# Patient Record
Sex: Female | Born: 1990 | Race: Black or African American | Hispanic: No | Marital: Single | Smoking: Never smoker
Health system: Southern US, Community
[De-identification: ages and names within clinical notes are randomized; demographics above are authoritative.]

## PROBLEM LIST (undated history)

## (undated) DIAGNOSIS — E282 Polycystic ovarian syndrome: Secondary | ICD-10-CM

## (undated) DIAGNOSIS — E2609 Other primary hyperaldosteronism: Secondary | ICD-10-CM

## (undated) DIAGNOSIS — I10 Essential (primary) hypertension: Secondary | ICD-10-CM

## (undated) HISTORY — PX: NO PAST SURGERIES: SHX2092

## (undated) HISTORY — DX: Other primary hyperaldosteronism: E26.09

---

## 2016-01-08 ENCOUNTER — Inpatient Hospital Stay (HOSPITAL_COMMUNITY)
Admission: EM | Admit: 2016-01-08 | Discharge: 2016-01-11 | DRG: 305 | Disposition: A | Discharge status: Home / Self Care | Payer: BLUE CROSS/BLUE SHIELD | Attending: Internal Medicine | Admitting: Internal Medicine

## 2016-01-08 ENCOUNTER — Encounter (HOSPITAL_COMMUNITY): Payer: Self-pay | Admitting: Emergency Medicine

## 2016-01-08 ENCOUNTER — Emergency Department (HOSPITAL_COMMUNITY): Payer: BLUE CROSS/BLUE SHIELD

## 2016-01-08 DIAGNOSIS — E876 Hypokalemia: Secondary | ICD-10-CM | POA: Diagnosis present

## 2016-01-08 DIAGNOSIS — Z9114 Patient's other noncompliance with medication regimen: Secondary | ICD-10-CM

## 2016-01-08 DIAGNOSIS — R5383 Other fatigue: Secondary | ICD-10-CM | POA: Diagnosis present

## 2016-01-08 DIAGNOSIS — T501X5A Adverse effect of loop [high-ceiling] diuretics, initial encounter: Secondary | ICD-10-CM | POA: Diagnosis present

## 2016-01-08 DIAGNOSIS — Z91120 Patient's intentional underdosing of medication regimen due to financial hardship: Secondary | ICD-10-CM

## 2016-01-08 DIAGNOSIS — N179 Acute kidney failure, unspecified: Secondary | ICD-10-CM | POA: Diagnosis present

## 2016-01-08 DIAGNOSIS — Z8249 Family history of ischemic heart disease and other diseases of the circulatory system: Secondary | ICD-10-CM

## 2016-01-08 DIAGNOSIS — I10 Essential (primary) hypertension: Secondary | ICD-10-CM | POA: Diagnosis present

## 2016-01-08 DIAGNOSIS — R748 Abnormal levels of other serum enzymes: Secondary | ICD-10-CM | POA: Diagnosis present

## 2016-01-08 DIAGNOSIS — R609 Edema, unspecified: Secondary | ICD-10-CM

## 2016-01-08 DIAGNOSIS — R079 Chest pain, unspecified: Secondary | ICD-10-CM | POA: Diagnosis present

## 2016-01-08 DIAGNOSIS — I161 Hypertensive emergency: Principal | ICD-10-CM | POA: Diagnosis present

## 2016-01-08 DIAGNOSIS — Z818 Family history of other mental and behavioral disorders: Secondary | ICD-10-CM

## 2016-01-08 DIAGNOSIS — R002 Palpitations: Secondary | ICD-10-CM | POA: Diagnosis present

## 2016-01-08 DIAGNOSIS — E282 Polycystic ovarian syndrome: Secondary | ICD-10-CM | POA: Diagnosis present

## 2016-01-08 DIAGNOSIS — D649 Anemia, unspecified: Secondary | ICD-10-CM | POA: Diagnosis present

## 2016-01-08 DIAGNOSIS — Z6841 Body Mass Index (BMI) 40.0 and over, adult: Secondary | ICD-10-CM

## 2016-01-08 DIAGNOSIS — I509 Heart failure, unspecified: Secondary | ICD-10-CM

## 2016-01-08 HISTORY — DX: Essential (primary) hypertension: I10

## 2016-01-08 HISTORY — DX: Polycystic ovarian syndrome: E28.2

## 2016-01-08 LAB — I-STAT TROPONIN, ED: TROPONIN I, POC: 0.03 ng/mL (ref 0.00–0.08)

## 2016-01-08 LAB — CBC
HCT: 32.8 % — ABNORMAL LOW (ref 36.0–46.0)
Hemoglobin: 10.3 g/dL — ABNORMAL LOW (ref 12.0–15.0)
MCH: 26.3 pg (ref 26.0–34.0)
MCHC: 31.4 g/dL (ref 30.0–36.0)
MCV: 83.7 fL (ref 78.0–100.0)
PLATELETS: 306 10*3/uL (ref 150–400)
RBC: 3.92 MIL/uL (ref 3.87–5.11)
RDW: 15.2 % (ref 11.5–15.5)
WBC: 10.6 10*3/uL — AB (ref 4.0–10.5)

## 2016-01-08 LAB — BASIC METABOLIC PANEL
Anion gap: 11 (ref 5–15)
BUN: 14 mg/dL (ref 6–20)
CALCIUM: 9.7 mg/dL (ref 8.9–10.3)
CO2: 28 mmol/L (ref 22–32)
Chloride: 104 mmol/L (ref 101–111)
Creatinine, Ser: 1.05 mg/dL — ABNORMAL HIGH (ref 0.44–1.00)
GFR calc Af Amer: >60 mL/min (ref >60)
Glucose, Bld: 109 mg/dL — ABNORMAL HIGH (ref 65–99)
POTASSIUM: 3.1 mmol/L — AB (ref 3.5–5.1)
SODIUM: 143 mmol/L (ref 135–145)

## 2016-01-08 LAB — BRAIN NATRIURETIC PEPTIDE: B NATRIURETIC PEPTIDE 5: 326.5 pg/mL — AB (ref 0.0–100.0)

## 2016-01-08 MED ORDER — NITROGLYCERIN IN D5W 200-5 MCG/ML-% IV SOLN
0.0000 ug/min | Freq: Once | INTRAVENOUS | Status: AC
  Administered 2016-01-09: 5 ug/min via INTRAVENOUS
  Filled 2016-01-08: qty 250

## 2016-01-08 MED ORDER — FUROSEMIDE 10 MG/ML IJ SOLN
60.0000 mg | Freq: Once | INTRAMUSCULAR | Status: AC
  Administered 2016-01-09: 60 mg via INTRAVENOUS
  Filled 2016-01-08: qty 6

## 2016-01-08 MED ORDER — POTASSIUM CHLORIDE CRYS ER 20 MEQ PO TBCR
40.0000 meq | EXTENDED_RELEASE_TABLET | Freq: Once | ORAL | Status: AC
  Administered 2016-01-08: 40 meq via ORAL
  Filled 2016-01-08: qty 2

## 2016-01-08 NOTE — ED Notes (Signed)
Pt c/o CP x2 days, and SOB x1 week, pt states she had similar problem last year and it was due to low iron, pt states shes borderline anemic. Pt in NAD at this time.

## 2016-01-08 NOTE — ED Notes (Signed)
MD at bedside. 

## 2016-01-08 NOTE — ED Provider Notes (Signed)
CSN: [id number]     Arrival date & time 01/08/16  1842 History   First MD Initiated Contact with Patient 01/08/16 2228     Chief Complaint  Patient presents with  . Chest Pain   HPI Patient reports a 1 week history of SOB with rest and ambulation.  She also notes chest pressure that is 7/10, present with SOB.  She notes CP at rest and with ambulation.  Chest pain not relieved by Motrin/ Tylenol. She reports intermittent nausea, vomiting x1.  Endorsing productive cough, orthopnea and LE edema. No rhinorrhea, sore throat, headache, visual changes, diaphoresis.  No known history of pulmonary or cardiac disease.  She reports h/o HTN for which she was on some antihypertensive that she cannot recall the name of.  She notes that she ran out of this medication several days ago.  Does not have a PCP.    Past Medical History  Diagnosis Date  . Polycystic disease, ovaries   . Hypertension    History reviewed. No pertinent past surgical history. No family history on file. Social History  Substance Use Topics  . Smoking status: Never Smoker   . Smokeless tobacco: None  . Alcohol Use: Yes   OB History    No data available     Review of Systems  Constitutional: Positive for fever (subjective).  HENT: Negative for congestion, rhinorrhea and sore throat.   Eyes: Negative for photophobia and visual disturbance.  Respiratory: Positive for cough, chest tightness (pressure) and shortness of breath. Negative for wheezing.   Cardiovascular: Positive for chest pain, palpitations and leg swelling.  Gastrointestinal: Positive for nausea and vomiting (x1). Negative for abdominal pain and diarrhea.  Musculoskeletal: Negative for myalgias.  Skin: Negative for rash.  Neurological: Negative for dizziness, weakness and light-headedness.    Allergies  Review of patient's allergies indicates no known allergies.  Home Medications   Prior to Admission medications   Not on File   BP 196/150 mmHg  Pulse 91   Temp(Src) 99.6 F (37.6 C) (Oral)  Resp 27  Ht  (1.626 m)  Wt 116.212 kg  BMI 43.96 kg/m2  SpO2 98%  LMP 12/08/2015 Physical Exam  Constitutional: She is oriented to person, place, and time. She appears well-developed and well-nourished. No distress.  obese  HENT:  Head: Normocephalic and atraumatic.  Mouth/Throat: Oropharynx is clear and moist.  Eyes: Conjunctivae are normal. Pupils are equal, round, and reactive to light.  Neck: Normal range of motion. Neck supple. No JVD present.  Cardiovascular: Normal rate, regular rhythm and normal heart sounds.   No murmur heard. Pulmonary/Chest: Effort normal and breath sounds normal. No respiratory distress. She has no wheezes. She has no rales.  Abdominal: She exhibits no distension. There is no tenderness. There is no rebound and no guarding.  Musculoskeletal: Normal range of motion. Edema: +1 LE edema to ankles bilaterally.  Neurological: She is alert and oriented to person, place, and time. She exhibits normal muscle tone. Coordination normal.  Skin: Skin is warm. No rash noted. She is not diaphoretic. No erythema.  Psychiatric: She has a normal mood and affect. Her behavior is normal. Judgment and thought content normal.   ED Course  Procedures (including critical care time) Labs Review Labs Reviewed  BASIC METABOLIC PANEL - Abnormal; Notable for the following:    Potassium 3.1 (*)    Glucose, Bld 109 (*)    Creatinine, Ser 1.05 (*)    All other components within normal limits  CBC -  Abnormal; Notable for the following:    WBC 10.6 (*)    Hemoglobin 10.3 (*)    HCT 32.8 (*)    All other components within normal limits  BRAIN NATRIURETIC PEPTIDE - Abnormal; Notable for the following:    B Natriuretic Peptide 326.5 (*)    All other components within normal limits  I-STAT TROPOININ, ED    Imaging Review Dg Chest 2 View  01/08/2016  CLINICAL DATA:  Chest pain for 2 days.  Shortness of breath EXAM: CHEST  2 VIEW  COMPARISON:  None. FINDINGS: Cardiomegaly. Mild vascular congestion. No overt edema. No confluent opacities or effusions. No acute bony abnormality. IMPRESSION: Cardiomegaly, vascular congestion. Electronically Signed   By: Charlett Nose M.D.   On: 01/08/2016 19:45   I have personally reviewed and evaluated these images and lab results as part of my medical decision-making.   EKG Interpretation   Date/Time:  Friday January 08 2016 18:52:31 EST Ventricular Rate:  87 PR Interval:  152 QRS Duration: 74 QT Interval:  392 QTC Calculation: 471 R Axis:   82 Text Interpretation:  Sinus rhythm with Premature atrial complexes  Biatrial enlargement Abnormal ECG Confirmed by BEATON  MD, ROBERT ([id number])  on 01/08/2016 10:57:17 PM      MDM   Final diagnoses:  None   2231: Mild leukocytosis 10.6.  Low grade fever 99.2F.  Hgb 10.3.  No previous labs to compare.  K 3.1. KDur 40 mEq x1 ordered.  iTrop negative x1.  CXR with cardiomegaly and vascular congestion.  Again, no previous to compare to.  EKG with PACs but no evidence of ischemia.  2315: Discussed care with my attending Dr Radford Pax.  Recommending that we treat BP with Lasix IV  x1, Nitro gtt to reduce afterload.  Will continue to monitor BP.  Repeat iTrop also ordered.  2355: BNP elevated to 326.5.  Consult placed for unassigned medical admission.  OLA RAAP is a 25 y.o. female that presents to ED with 1 week duration of SOB and chest pressure.  She appears fluid overloaded on exam, though pulmonary exam WNL.  CXR with vascular congestion and cardiomegaly.  BNP 326.5.  BP elevated to 190/150's, exhibiting hypertensive emergency with constellation of symptoms.  Nitro gtt and Lasix IV  x1 ordered.  Highly suspect new onset CHF likely secondary to uncontrolled HTN and possible OSA vs OHS.  Low suspicion for PE or pericarditis at this point.  Discussed care with Dr Robb Matar.  Will admit patient to SDU for continued nitro gtt and chest pain  management.  Raliegh Ip, DO 01/09/16 1610  Nelva Nay, MD 01/21/16 [phone]

## 2016-01-09 ENCOUNTER — Observation Stay (HOSPITAL_COMMUNITY): Payer: BLUE CROSS/BLUE SHIELD

## 2016-01-09 ENCOUNTER — Encounter (HOSPITAL_COMMUNITY): Payer: Self-pay | Admitting: Internal Medicine

## 2016-01-09 DIAGNOSIS — I509 Heart failure, unspecified: Secondary | ICD-10-CM

## 2016-01-09 DIAGNOSIS — R079 Chest pain, unspecified: Secondary | ICD-10-CM | POA: Diagnosis present

## 2016-01-09 DIAGNOSIS — D649 Anemia, unspecified: Secondary | ICD-10-CM | POA: Diagnosis present

## 2016-01-09 DIAGNOSIS — I10 Essential (primary) hypertension: Secondary | ICD-10-CM | POA: Diagnosis present

## 2016-01-09 DIAGNOSIS — R5383 Other fatigue: Secondary | ICD-10-CM | POA: Diagnosis present

## 2016-01-09 DIAGNOSIS — R748 Abnormal levels of other serum enzymes: Secondary | ICD-10-CM | POA: Diagnosis present

## 2016-01-09 DIAGNOSIS — E876 Hypokalemia: Secondary | ICD-10-CM | POA: Diagnosis present

## 2016-01-09 DIAGNOSIS — Z91120 Patient's intentional underdosing of medication regimen due to financial hardship: Secondary | ICD-10-CM | POA: Diagnosis not present

## 2016-01-09 DIAGNOSIS — E282 Polycystic ovarian syndrome: Secondary | ICD-10-CM | POA: Diagnosis present

## 2016-01-09 DIAGNOSIS — Z9114 Patient's other noncompliance with medication regimen: Secondary | ICD-10-CM | POA: Diagnosis not present

## 2016-01-09 DIAGNOSIS — R002 Palpitations: Secondary | ICD-10-CM | POA: Diagnosis present

## 2016-01-09 DIAGNOSIS — I5031 Acute diastolic (congestive) heart failure: Secondary | ICD-10-CM | POA: Diagnosis not present

## 2016-01-09 DIAGNOSIS — R609 Edema, unspecified: Secondary | ICD-10-CM | POA: Diagnosis present

## 2016-01-09 DIAGNOSIS — Z8249 Family history of ischemic heart disease and other diseases of the circulatory system: Secondary | ICD-10-CM | POA: Diagnosis not present

## 2016-01-09 DIAGNOSIS — I161 Hypertensive emergency: Principal | ICD-10-CM

## 2016-01-09 DIAGNOSIS — N179 Acute kidney failure, unspecified: Secondary | ICD-10-CM | POA: Diagnosis present

## 2016-01-09 DIAGNOSIS — T501X5A Adverse effect of loop [high-ceiling] diuretics, initial encounter: Secondary | ICD-10-CM | POA: Diagnosis present

## 2016-01-09 DIAGNOSIS — Z6841 Body Mass Index (BMI) 40.0 and over, adult: Secondary | ICD-10-CM | POA: Diagnosis not present

## 2016-01-09 DIAGNOSIS — Z818 Family history of other mental and behavioral disorders: Secondary | ICD-10-CM | POA: Diagnosis not present

## 2016-01-09 LAB — CBC
HCT: 34.7 % — ABNORMAL LOW (ref 36.0–46.0)
Hemoglobin: 10.9 g/dL — ABNORMAL LOW (ref 12.0–15.0)
MCH: 26.5 pg (ref 26.0–34.0)
MCHC: 31.4 g/dL (ref 30.0–36.0)
MCV: 84.2 fL (ref 78.0–100.0)
PLATELETS: 307 10*3/uL (ref 150–400)
RBC: 4.12 MIL/uL (ref 3.87–5.11)
RDW: 15.2 % (ref 11.5–15.5)
WBC: 9.6 10*3/uL (ref 4.0–10.5)

## 2016-01-09 LAB — PHOSPHORUS: Phosphorus: 3.3 mg/dL (ref 2.5–4.6)

## 2016-01-09 LAB — I-STAT TROPONIN, ED: Troponin i, poc: 0.05 ng/mL (ref 0.00–0.08)

## 2016-01-09 LAB — TROPONIN I: Troponin I: 0.05 ng/mL — ABNORMAL HIGH (ref <0.031)

## 2016-01-09 LAB — CREATININE, SERUM
CREATININE: 1.12 mg/dL — AB (ref 0.44–1.00)
GFR calc Af Amer: >60 mL/min (ref >60)
GFR calc non Af Amer: >60 mL/min (ref >60)

## 2016-01-09 LAB — MAGNESIUM: Magnesium: 2 mg/dL (ref 1.7–2.4)

## 2016-01-09 MED ORDER — FUROSEMIDE 10 MG/ML IJ SOLN: 40.0000 mg | Freq: Two times a day (BID) | INTRAMUSCULAR | Status: DC

## 2016-01-09 MED ORDER — NITROGLYCERIN IN D5W 200-5 MCG/ML-% IV SOLN
0.0000 ug/min | Freq: Once | INTRAVENOUS | Status: DC
Start: 2016-01-09 — End: 2016-01-09

## 2016-01-09 MED ORDER — SODIUM CHLORIDE 0.9 % IV SOLN: 250.0000 mL | INTRAVENOUS | Status: DC | PRN

## 2016-01-09 MED ORDER — FUROSEMIDE 10 MG/ML IJ SOLN
20.0000 mg | Freq: Once | INTRAMUSCULAR | Status: AC
  Administered 2016-01-09: 20 mg via INTRAVENOUS
  Filled 2016-01-09: qty 2

## 2016-01-09 MED ORDER — ENOXAPARIN SODIUM 40 MG/0.4ML ~~LOC~~ SOLN: 40.0000 mg | SUBCUTANEOUS | Status: DC

## 2016-01-09 MED ORDER — CARVEDILOL 12.5 MG PO TABS
12.5000 mg | ORAL_TABLET | Freq: Two times a day (BID) | ORAL | Status: DC
  Administered 2016-01-09 – 2016-01-11 (×5): 12.5 mg via ORAL
  Filled 2016-01-09 (×5): qty 1

## 2016-01-09 MED ORDER — LISINOPRIL 10 MG PO TABS
10.0000 mg | ORAL_TABLET | Freq: Every day | ORAL | Status: DC
Start: 2016-01-09 — End: 2016-01-10
  Administered 2016-01-09: 10 mg via ORAL
  Filled 2016-01-09 (×2): qty 1

## 2016-01-09 MED ORDER — ACETAMINOPHEN 325 MG PO TABS
650.0000 mg | ORAL_TABLET | Freq: Four times a day (QID) | ORAL | Status: DC | PRN
  Administered 2016-01-09: 650 mg via ORAL
  Filled 2016-01-09: qty 2

## 2016-01-09 MED ORDER — MAGNESIUM OXIDE 400 (241.3 MG) MG PO TABS
400.0000 mg | ORAL_TABLET | Freq: Two times a day (BID) | ORAL | Status: DC
  Administered 2016-01-09 – 2016-01-11 (×5): 400 mg via ORAL
  Filled 2016-01-09 (×5): qty 1

## 2016-01-09 MED ORDER — ONDANSETRON HCL 4 MG/2ML IJ SOLN
4.0000 mg | Freq: Four times a day (QID) | INTRAMUSCULAR | Status: DC | PRN
  Administered 2016-01-09: 4 mg via INTRAVENOUS
  Filled 2016-01-09: qty 2

## 2016-01-09 MED ORDER — SODIUM CHLORIDE 0.9% FLUSH: 3.0000 mL | INTRAVENOUS | Status: DC | PRN

## 2016-01-09 MED ORDER — HYDRALAZINE HCL 20 MG/ML IJ SOLN
10.0000 mg | Freq: Four times a day (QID) | INTRAMUSCULAR | Status: DC | PRN
  Filled 2016-01-09: qty 1

## 2016-01-09 MED ORDER — NITROGLYCERIN 0.4 MG SL SUBL
0.4000 mg | SUBLINGUAL_TABLET | SUBLINGUAL | Status: DC | PRN
  Administered 2016-01-09: 0.4 mg via SUBLINGUAL
  Filled 2016-01-09: qty 1

## 2016-01-09 MED ORDER — MORPHINE SULFATE (PF) 2 MG/ML IV SOLN: 1.0000 mg | INTRAVENOUS | Status: DC | PRN

## 2016-01-09 MED ORDER — ENOXAPARIN SODIUM 60 MG/0.6ML ~~LOC~~ SOLN
60.0000 mg | SUBCUTANEOUS | Status: DC
  Administered 2016-01-09 – 2016-01-11 (×3): 60 mg via SUBCUTANEOUS
  Filled 2016-01-09 (×3): qty 0.6

## 2016-01-09 MED ORDER — POTASSIUM CHLORIDE CRYS ER 20 MEQ PO TBCR
20.0000 meq | EXTENDED_RELEASE_TABLET | Freq: Two times a day (BID) | ORAL | Status: DC
  Administered 2016-01-09 (×2): 20 meq via ORAL
  Filled 2016-01-09 (×3): qty 1

## 2016-01-09 MED ORDER — ASPIRIN EC 81 MG PO TBEC
81.0000 mg | DELAYED_RELEASE_TABLET | Freq: Every day | ORAL | Status: DC
  Administered 2016-01-09 – 2016-01-11 (×3): 81 mg via ORAL
  Filled 2016-01-09 (×3): qty 1

## 2016-01-09 MED ORDER — SODIUM CHLORIDE 0.9% FLUSH
3.0000 mL | Freq: Two times a day (BID) | INTRAVENOUS | Status: DC
Start: 2016-01-09 — End: 2016-01-11
  Administered 2016-01-09 – 2016-01-10 (×4): 3 mL via INTRAVENOUS

## 2016-01-09 MED ORDER — PANTOPRAZOLE SODIUM 40 MG PO TBEC
40.0000 mg | DELAYED_RELEASE_TABLET | Freq: Every day | ORAL | Status: DC
  Administered 2016-01-10 – 2016-01-11 (×2): 40 mg via ORAL
  Filled 2016-01-09 (×2): qty 1

## 2016-01-09 NOTE — ED Notes (Signed)
Spoke with admitting MD. Made aware of BP, will give coreg now and hydralazine.

## 2016-01-09 NOTE — H&P (Signed)
[facility] History and Physical  Destiny Dunn WUJ:[id number] DOB: [birth year] DOA: 01/08/2016  Referring physician: Raliegh Ip, DO PCP: No primary care provider on file.   Chief Complaint: Chest pain.  HPI: Destiny Dunn is a 25 y.o. female with a past medical history of hypertension, polycystic ovaries, morbid obesity who comes to the emergency department with a history of chest pain since yesterday, pressure-like associated with dyspnea, palpitations, lower extremity edema and fatigue. Patient also has had progressively worse dyspnea for the past week, since she is stopped her antihypertensive medications.  Per patient, she has had a labs on health insurance coverage and was unable to get her medications. Since she stopped in, she has been getting progressively worse dyspnea, associated with orthopnea, PND, cough and lower extremity edema. She has had chest pain as earlier described. She had an episode of nausea and emesis on Thursday, but denies abdominal pain and diarrhea, constipation melena or hematochezia.  When seen, the patient was in no acute distress she stated that she was feeling better with treatment.   Review of Systems:  Constitutional:  Positive fatigue. No weight loss, night sweats, Fevers, chills,   HEENT:  Hospital headache   Difficulty swallowing,Tooth/dental problems,Sore throat,  No sneezing, itching, ear ache, nasal congestion, post nasal drip,  Cardio-vascular:  As earlier mentioned. GI:   nausea, vomiting, change in bowel habits No heartburn, indigestion, abdominal pain,diarrhea,  loss of appetite  Resp:  Positive dyspnea, cough. No wheezing, no hemoptysis. Skin:  no rash or lesions.  GU:  History of polycystic ovaries. no dysuria, change in color of urine, no urgency or frequency. No flank pain.  Musculoskeletal:  No joint pain or swelling. No decreased range of motion. No back pain.  Psych:  No change in mood or affect.  No depression or anxiety. No memory loss.   Past Medical History  Diagnosis Date  . Polycystic disease, ovaries   . Hypertension    History reviewed. No pertinent past surgical history. Social History:  reports that she has never smoked. She does not have any smokeless tobacco history on file. She reports that she drinks alcohol. Her drug history is not on file.  No Known Allergies  History reviewed.  Mother hypertension. Brother schizophrenia  Prior to Admission medications   Not on File   Physical Exam: Filed Vitals:   01/09/16 0145 01/09/16 0155 01/09/16 0200 01/09/16 0248  BP: 181/128 184/117 175/131 164/117  Pulse: 91 90 88 87  Temp:      TempSrc:      Resp:  Height:      Weight:      SpO2: 99% 100% 100% 100%    Wt Readings from Last 3 Encounters:  01/08/16 116.212 kg (256 lb 3.2 oz)    General:  Appears calm and comfortable Eyes: PERRL, normal lids, irises & conjunctiva ENT: Nasal cannula in place, grossly normal hearing, lips & tongue Neck: no LAD, masses or thyromegaly Cardiovascular: RRR, no m/r/g. No LE edema. Telemetry: SR, no arrhythmias  Respiratory: Bibasilar crackles. Normal respiratory effort. Abdomen: Obese, soft, ntnd Skin: no rash or induration seen on limited exam Musculoskeletal: grossly normal tone BUE/BLE Psychiatric: grossly normal mood and affect, speech fluent and appropriate Neurologic: Awake, alert, oriented 3, grossly non-focal.          Labs on Admission:  Basic Metabolic Panel:  Recent Labs Lab 01/07/16 1857 01/08/16 1857  NA  --  143  K  --  3.1*  CL  --  104  CO2  --  28  GLUCOSE  --  109*  BUN  --  14  CREATININE  --  1.05*  CALCIUM  --  9.7  MG 2.0  --   PHOS 3.3  --    CBC:  Recent Labs Lab 01/08/16 1857  WBC 10.6*  HGB 10.3*  HCT 32.8*  MCV 83.7  PLT 306    BNP (last 3 results)  Recent Labs  01/08/16 1857  BNP 326.5*    Radiological Exams on Admission: Dg Chest 2 View  01/08/2016   CLINICAL DATA:  Chest pain for 2 days.  Shortness of breath EXAM: CHEST  2 VIEW COMPARISON:  None. FINDINGS: Cardiomegaly. Mild vascular congestion. No overt edema. No confluent opacities or effusions. No acute bony abnormality. IMPRESSION: Cardiomegaly, vascular congestion. Electronically Signed   By: Charlett Nose M.D.   On: 01/08/2016 19:45    EKG: Independently reviewed. Vent. rate 87 BPM PR interval 152 ms QRS duration 74 ms QT/QTc 392/471 ms P-R-T axes 62 82 16 Sinus rhythm with Premature atrial complexes Biatrial enlargement Abnormal ECG  Assessment/Plan Principal Problem:   Hypertensive emergency Admit to a stepdown. Continue nitroglycerin infusion. Start lisinopril 10 mg by mouth daily. Continue furosemide IV push twice a day.  Active Problems:   Chest pain Cardiac monitoring. Check serial troponin levels. Check echocardiogram.    Hypokalemia Continue oral replacement. Monitor potassium level. Check magnesium level.    CHF, acute (HCC) Continue diuresis with furosemide. Continue supplemental oxygen. Check echocardiogram.    Anemia Monitor hematocrit and hemoglobin. Continue ferrous sulfate 325 mg by mouth daily.    Morbid obesity (HCC) Advised to take drastic lifestyle modifications.    Polycystic ovarian disease Patient to follow-up with GYN as an outpatient. Advised to continue lifestyle modifications for weight loss.    Code Status: Full code. DVT Prophylaxis: Lovenox SQ. Family Communication:  Disposition Plan: Admit to telemetry, cardiac monitoring and troponin levels trending  Time spent: Over 70 minutes were spent in the process admission.  Bobette Mo, M.D. [facility] Pager [phone].

## 2016-01-09 NOTE — ED Notes (Signed)
Admitting MD made aware of BP 165/107, will give coreg now.

## 2016-01-09 NOTE — Progress Notes (Addendum)
Patient arrived 2W - A&O, denies dyspnea or pain. Diastolic remains elevated @ 1519 is 144/104; 1613 - 156/101. Tx paged update to Dr. Jerral Ralph. Dr. Jerral Ralph return phone call - he stated aware and recently adjusted medications including Coreg and Lisinopril - will wait until am to further adjust Rx's.

## 2016-01-09 NOTE — Progress Notes (Signed)
Seen and examined Admitted earlier this am BP much better-no longer SOB-taper off NTG infusion-start Coreg, continue Lisinopril.  Can go to tele Await Echo, Lower Ext Doppler

## 2016-01-09 NOTE — Progress Notes (Signed)
Patient c/o chest tightness, ranks "3" - tx paged Dr. Jerral Ralph.

## 2016-01-09 NOTE — Progress Notes (Signed)
  Echocardiogram 2D Echocardiogram has been performed.  Peola Joynt 01/09/2016, 11:17 AM

## 2016-01-09 NOTE — ED Notes (Signed)
Spoke with admitting MD, reports to give 20 mg IV Lasix, now along with lisinopril. Try to titrate off nitro, plan to down grade to telemetry bed.

## 2016-01-10 ENCOUNTER — Inpatient Hospital Stay (HOSPITAL_COMMUNITY): Payer: BLUE CROSS/BLUE SHIELD

## 2016-01-10 DIAGNOSIS — R609 Edema, unspecified: Secondary | ICD-10-CM

## 2016-01-10 DIAGNOSIS — R079 Chest pain, unspecified: Secondary | ICD-10-CM

## 2016-01-10 DIAGNOSIS — D649 Anemia, unspecified: Secondary | ICD-10-CM

## 2016-01-10 DIAGNOSIS — I5031 Acute diastolic (congestive) heart failure: Secondary | ICD-10-CM

## 2016-01-10 LAB — TSH: TSH: 1.297 u[IU]/mL (ref 0.350–4.500)

## 2016-01-10 LAB — URINALYSIS, ROUTINE W REFLEX MICROSCOPIC
BILIRUBIN URINE: NEGATIVE
GLUCOSE, UA: NEGATIVE mg/dL
Hgb urine dipstick: NEGATIVE
KETONES UR: NEGATIVE mg/dL
Nitrite: NEGATIVE
PROTEIN: 100 mg/dL — AB
Specific Gravity, Urine: 1.022 (ref 1.005–1.030)
pH: 5.5 (ref 5.0–8.0)

## 2016-01-10 LAB — BASIC METABOLIC PANEL
ANION GAP: 9 (ref 5–15)
BUN: 19 mg/dL (ref 6–20)
CHLORIDE: 104 mmol/L (ref 101–111)
CO2: 28 mmol/L (ref 22–32)
Calcium: 9.1 mg/dL (ref 8.9–10.3)
Creatinine, Ser: 1.45 mg/dL — ABNORMAL HIGH (ref 0.44–1.00)
GFR calc Af Amer: 57 mL/min — ABNORMAL LOW (ref >60)
GFR calc non Af Amer: 50 mL/min — ABNORMAL LOW (ref >60)
GLUCOSE: 109 mg/dL — AB (ref 65–99)
POTASSIUM: 3.4 mmol/L — AB (ref 3.5–5.1)
SODIUM: 141 mmol/L (ref 135–145)

## 2016-01-10 LAB — URINE MICROSCOPIC-ADD ON

## 2016-01-10 LAB — TROPONIN I: TROPONIN I: 0.05 ng/mL — AB (ref <0.031)

## 2016-01-10 MED ORDER — POTASSIUM CHLORIDE CRYS ER 20 MEQ PO TBCR
20.0000 meq | EXTENDED_RELEASE_TABLET | Freq: Two times a day (BID) | ORAL | Status: AC
  Administered 2016-01-10 (×2): 20 meq via ORAL
  Filled 2016-01-10 (×2): qty 1

## 2016-01-10 MED ORDER — AMLODIPINE BESYLATE 5 MG PO TABS
5.0000 mg | ORAL_TABLET | Freq: Every day | ORAL | Status: DC
Start: 2016-01-10 — End: 2016-01-11
  Administered 2016-01-10 – 2016-01-11 (×2): 5 mg via ORAL
  Filled 2016-01-10 (×2): qty 1

## 2016-01-10 NOTE — Progress Notes (Addendum)
*  Preliminary Results* Bilateral lower extremity venous duplex completed. Study was technically difficult due to patient body habitus. Visualized veins of bilateral lower extremities are negative for deep vein thrombosis. Bilateral lower extremity veins exhibit pulsatile flow, suggestive of possible elevated right sided heart pressure. There is no evidence of Baker's cyst bilaterally.  01/10/2016  Gertie Fey, RVT, RDCS, RDMS

## 2016-01-10 NOTE — Progress Notes (Signed)
PATIENT DETAILS Name: Destiny Dunn Age: 25 y.o. Sex: female Date of Birth: 1991/01/18 Admit Date: 01/08/2016 Admitting Physician Bobette Mo, MD PCP:No primary care provider on file.  Subjective: Feels much better. No SOB. No chest pain this am.   Assessment/Plan: Principal Problem: Hypertensive emergency: secondary to non compliance to medications-due to insurance issues. BP better-NTG infusion weaned off, continue Coreg and Amlodipine. Will d/c lisinopril given mild increase in creatinine. Will need workup for secondary causes of hypertension-however that can be done in the outpatient setting.  Active Problems: Chest pain: Resolved, doubt cardiac. Echocardiogram did not show any wall motion abnormalities.  Acute diastolic heart failure: Much more compensated, suspect this was secondary to uncontrolled hypertension. Hold Lasix today.  Minimally elevated troponins: Suspect secondary to CHF/hypertensive emergency-trend is flat-not consistent with ACS.  Mild ARF: Likely secondary to Lasix and better blood pressure control. Discontinue lisinopril-repeat electrolytes tomorrow. Check UA.  Morbid obesity: Counseled regarding importance of weight loss  Known history of polycystic ovarian disease: Outpatient follow up with GYN.  Anemia: Chronic issue, suspect stable for outpatient workup/monitoring.  Disposition: Remain inpatient-home 2/27.  Antimicrobial agents  See below  Anti-infectives    None      DVT Prophylaxis: Prophylactic Lovenox  Code Status: Full code   Family Communication Multiple family members at bedside  Procedures: Noen  CONSULTS:  None  Time spent 30 minutes-Greater than 50% of this time was spent in counseling, explanation of diagnosis, planning of further management, and coordination of care.  MEDICATIONS: Scheduled Meds: . amLODipine  5 mg Oral Daily  . aspirin EC  81 mg Oral Daily  . carvedilol  12.5 mg Oral  BID WC  . enoxaparin (LOVENOX) injection  60 mg Subcutaneous Q24H  . magnesium oxide  400 mg Oral BID  . pantoprazole  40 mg Oral Q1200  . potassium chloride  20 mEq Oral BID  . sodium chloride flush  3 mL Intravenous Q12H   Continuous Infusions:  PRN Meds:.sodium chloride, acetaminophen, hydrALAZINE, morphine injection, nitroGLYCERIN, ondansetron (ZOFRAN) IV, sodium chloride flush    PHYSICAL EXAM: Vital signs in last 24 hours: Filed Vitals:   01/09/16 1708 01/09/16 1719 01/09/16 1954 01/10/16 0508  BP: 157/106 155/100 152/103 133/93  Pulse: 82 85 83 68  Temp: 98.5 F (36.9 C)  98.5 F (36.9 C) 98.6 F (37 C)  TempSrc: Oral  Oral Oral  Resp: Height:      Weight:    114.941 kg (253 lb 6.4 oz)  SpO2: 99% 100% 100% 100%    Weight change: -2.268 kg (-5 lb) Filed Weights   01/08/16 1858 01/09/16 1519 01/10/16 0508  Weight: 116.212 kg (256 lb 3.2 oz) 113.944 kg (251 lb 3.2 oz) 114.941 kg (253 lb 6.4 oz)   Body mass index is 43.47 kg/(m^2).   Gen Exam: Awake and alert with clear speech.   Neck: Supple, No JVD.   Chest: B/L Clear.   CVS: S1 S2 Regular, no murmurs.  Abdomen: soft, BS +, non tender, non distended.  Extremities: no edema, lower extremities warm to touch. Neurologic: Non Focal.   Skin: No Rash.   Wounds: N/A.   Intake/Output from previous day:  Intake/Output Summary (Last 24 hours) at 01/10/16 1123 Last data filed at 01/09/16 2300  Gross per 24 hour  Intake    240 ml  Output      0  ml  Net    240 ml     LAB RESULTS: CBC  Recent Labs Lab 01/08/16 1857 01/09/16 0812  WBC 10.6* 9.6  HGB 10.3* 10.9*  HCT 32.8* 34.7*  PLT 306 307  MCV 83.7 84.2  MCH 26.3 26.5  MCHC 31.4 31.4  RDW 15.2 15.2    Chemistries   Recent Labs Lab 01/07/16 1857 01/08/16 1857 01/09/16 0812 01/10/16 0425  NA  --  143  --  141  K  --  3.1*  --  3.4*  CL  --  104  --  104  CO2  --  28  --  28  GLUCOSE  --  109*  --  109*  BUN  --  14  --  19    CREATININE  --  1.05* 1.12* 1.45*  CALCIUM  --  9.7  --  9.1  MG 2.0  --   --   --     CBG: No results for input(s): GLUCAP in the last 168 hours.  GFR Estimated Creatinine Clearance: 73.8 mL/min (by C-G formula based on Cr of 1.45).  Coagulation profile No results for input(s): INR, PROTIME in the last 168 hours.  Cardiac Enzymes  Recent Labs Lab 01/09/16 0812 01/10/16 0425  TROPONINI 0.05* 0.05*    Invalid input(s): POCBNP No results for input(s): DDIMER in the last 72 hours. No results for input(s): HGBA1C in the last 72 hours. No results for input(s): CHOL, HDL, LDLCALC, TRIG, CHOLHDL, LDLDIRECT in the last 72 hours. No results for input(s): TSH, T4TOTAL, T3FREE, THYROIDAB in the last 72 hours.  Invalid input(s): FREET3 No results for input(s): VITAMINB12, FOLATE, FERRITIN, TIBC, IRON, RETICCTPCT in the last 72 hours. No results for input(s): LIPASE, AMYLASE in the last 72 hours.  Urine Studies No results for input(s): UHGB, CRYS in the last 72 hours.  Invalid input(s): UACOL, UAPR, USPG, UPH, UTP, UGL, UKET, UBIL, UNIT, UROB, ULEU, UEPI, UWBC, URBC, UBAC, CAST, UCOM, BILUA  MICROBIOLOGY: No results found for this or any previous visit (from the past 240 hour(s)).  RADIOLOGY STUDIES/RESULTS: Dg Chest 2 View  01/08/2016  CLINICAL DATA:  Chest pain for 2 days.  Shortness of breath EXAM: CHEST  2 VIEW COMPARISON:  None. FINDINGS: Cardiomegaly. Mild vascular congestion. No overt edema. No confluent opacities or effusions. No acute bony abnormality. IMPRESSION: Cardiomegaly, vascular congestion. Electronically Signed   By: Charlett Nose M.D.   On: 01/08/2016 19:45    Jeoffrey Massed, MD  [facility] Pager:[phone]  If 7PM-7AM, please contact night-coverage [email or web address] Password [email or web address] 01/10/2016, 11:23 AM   LOS: 1 day

## 2016-01-10 NOTE — Progress Notes (Signed)
Utilization review completed.  

## 2016-01-11 ENCOUNTER — Encounter (HOSPITAL_COMMUNITY): Payer: Self-pay | Admitting: General Practice

## 2016-01-11 DIAGNOSIS — E282 Polycystic ovarian syndrome: Secondary | ICD-10-CM

## 2016-01-11 LAB — BASIC METABOLIC PANEL
Anion gap: 13 (ref 5–15)
BUN: 24 mg/dL — AB (ref 6–20)
CHLORIDE: 103 mmol/L (ref 101–111)
CO2: 25 mmol/L (ref 22–32)
Calcium: 9.5 mg/dL (ref 8.9–10.3)
Creatinine, Ser: 1.32 mg/dL — ABNORMAL HIGH (ref 0.44–1.00)
GFR, EST NON AFRICAN AMERICAN: 55 mL/min — AB (ref >60)
Glucose, Bld: 105 mg/dL — ABNORMAL HIGH (ref 65–99)
POTASSIUM: 3.5 mmol/L (ref 3.5–5.1)
SODIUM: 141 mmol/L (ref 135–145)

## 2016-01-11 LAB — LIPID PANEL
CHOL/HDL RATIO: 6 ratio
CHOLESTEROL: 155 mg/dL (ref 0–200)
HDL: 26 mg/dL — ABNORMAL LOW (ref >40)
LDL Cholesterol: 96 mg/dL (ref 0–99)
TRIGLYCERIDES: 163 mg/dL — AB (ref <150)
VLDL: 33 mg/dL (ref 0–40)

## 2016-01-11 MED ORDER — CARVEDILOL 12.5 MG PO TABS: 12.5000 mg | ORAL_TABLET | Freq: Two times a day (BID) | ORAL | Status: DC

## 2016-01-11 MED ORDER — PANTOPRAZOLE SODIUM 40 MG PO TBEC: 40.0000 mg | DELAYED_RELEASE_TABLET | Freq: Every day | ORAL | Status: DC

## 2016-01-11 MED ORDER — AMLODIPINE BESYLATE 5 MG PO TABS: 5.0000 mg | ORAL_TABLET | Freq: Every day | ORAL | Status: DC

## 2016-01-11 MED ORDER — ASPIRIN 81 MG PO TBEC: 81.0000 mg | DELAYED_RELEASE_TABLET | Freq: Every day | ORAL | Status: DC

## 2016-01-11 MED ORDER — PANTOPRAZOLE SODIUM 40 MG PO TBEC: 40.0000 mg | DELAYED_RELEASE_TABLET | Freq: Every day | ORAL | Status: AC

## 2016-01-11 MED ORDER — CARVEDILOL 12.5 MG PO TABS: 12.5000 mg | ORAL_TABLET | Freq: Two times a day (BID) | ORAL | Status: AC

## 2016-01-11 MED ORDER — ASPIRIN 81 MG PO TBEC: 81.0000 mg | DELAYED_RELEASE_TABLET | Freq: Every day | ORAL | Status: AC

## 2016-01-11 NOTE — Consult Note (Signed)
CARDIOLOGY CONSULT NOTE  Patient ID: KAYDI KLEY MRN: [id number] DOB/AGE: 09-Dec-1990 25 y.o.  Admit date: 01/08/2016 Referring Physician: Internal Medicine Primary Physician:  No primary care provider on file. Reason for Consultation: Hypertensive Emergency  HPI: Destiny Dunn  is a 25 y.o. female with a history of PCOS and hypertension, previously on medical therapy which was stopped due to loss of insurance. This was started several months ago at an urgent care. She does not have a PCP and has been out of medication for about 4 days. She cannot recall what medications she was on or what Urgent Care she went to.  She presented to the ED on 01/08/2016 with chest pain and shortness of breath for several days with cough, orthopnea, and lower extremity edema. Blood pressure 186/127 on arrival. She was started on Ntg gtt and chest pain and shortness of breath resolved as blood pressure improved. She was then transitioned to PO Coreg and lisinopril. Creatinine increased after starting lisinopril so this was discontinued and she was started on amlodipine.   Past Medical History  Diagnosis Date  . Polycystic disease, ovaries   . Hypertension      History reviewed. No pertinent past surgical history.   History reviewed. No pertinent family history.   Social History: Social History   Social History  . Marital Status: Single    Spouse Name: N/A  . Number of Children: N/A  . Years of Education: N/A   Occupational History  . Not on file.   Social History Main Topics  . Smoking status: Never Smoker   . Smokeless tobacco: Not on file  . Alcohol Use: Yes  . Drug Use: Not on file  . Sexual Activity: Not on file   Other Topics Concern  . Not on file   Social History Narrative     No prescriptions prior to admission     ROS: General: no fevers/chills/night sweats Eyes: no blurry vision, diplopia, or amaurosis ENT: no sore throat or hearing loss Resp: no cough, wheezing,  or hemoptysis CV: no edema or palpitations GI: no abdominal pain, nausea, vomiting, diarrhea, or constipation GU: no dysuria, frequency, or hematuria Skin: no rash Neuro: no headache, numbness, tingling, or weakness of extremities Musculoskeletal: no joint pain or swelling Heme: no bleeding, DVT, or easy bruising Endo: no polydipsia or polyuria    Physical Exam: Blood pressure 156/99, pulse 73, temperature 98.1 F (36.7 C), temperature source Oral, resp. rate 18, height  (1.626 m), weight 117 kg (257 lb 15 oz), last menstrual period 12/08/2015, SpO2 100 %.   General appearance: alert, cooperative, no distress and morbidly obese Neck: no adenopathy, no carotid bruit, no JVD, supple, symmetrical, trachea midline, thyroid not enlarged, symmetric, no tenderness/mass/nodules and short neck, difficult to exam Lungs: clear to auscultation bilaterally Chest wall: no tenderness Heart: regular rate and rhythm, S1, S2 normal, no murmur, click, rub or gallop Extremities: extremities normal, atraumatic, no cyanosis or edema Pulses: 2+ and symmetric Skin: Skin color, texture, turgor normal. No rashes or lesions Neurologic: Grossly normal  Labs:   Lab Results  Component Value Date   WBC 9.6 01/09/2016   HGB 10.9* 01/09/2016   HCT 34.7* 01/09/2016   MCV 84.2 01/09/2016   PLT 307 01/09/2016    Recent Labs Lab 01/11/16 0210  NA 141  K 3.5  CL 103  CO2 25  BUN 24*  CREATININE 1.32*  CALCIUM 9.5  GLUCOSE 105*    Lipid Panel  No results  found for: CHOL, TRIG, HDL, CHOLHDL, VLDL, LDLCALC  BNP (last 3 results)  Recent Labs  01/08/16 1857  BNP 326.5*    HEMOGLOBIN A1C No results found for: HGBA1C, MPG  Cardiac Panel (last 3 results)  Recent Labs  01/09/16 0812 01/10/16 0425  TROPONINI 0.05* 0.05*    Lab Results  Component Value Date   TROPONINI 0.05* 01/10/2016     TSH  Recent Labs  01/10/16 1208  TSH 1.297    EKG 01/09/2016: sinus rhythm at a rate of  85bpm, normal axis, biatrial enlargement, diffuse nonspecific ST and T wave abnormality.  Echo 01/09/2016: LV cavity size was normal. Moderate LVH. LVEF 50-55%. Doppler parameters are consistent with restrictive physiology,indicative of decreased left ventricular diastolic complianceand/or increased left atrial pressure. Doppler parameters areconsistent with high ventricular filling pressure. Left atrium moderately dilated. Right ventricle systolic function was mildly reduced. Right atrium mildly dilated. A trivial pericardial effusion wasidentified.   Radiology: No results found.  Scheduled Meds: . amLODipine  5 mg Oral Daily  . aspirin EC  81 mg Oral Daily  . carvedilol  12.5 mg Oral BID WC  . enoxaparin (LOVENOX) injection  60 mg Subcutaneous Q24H  . magnesium oxide  400 mg Oral BID  . pantoprazole  40 mg Oral Q1200  . sodium chloride flush  3 mL Intravenous Q12H   Continuous Infusions:  PRN Meds:.sodium chloride, acetaminophen, hydrALAZINE, morphine injection, nitroGLYCERIN, ondansetron (ZOFRAN) IV, sodium chloride flush  ASSESSMENT AND PLAN:  1. Hypertensive emergency 2. Acute on chronic diastolic heart failure 3. Chest pain secondary to hypertension 4. Elevated troponin, flat, likely due to accelerated hypertension and acute diastolic heart failure 5. Normocytic anemia 6. Hypokalemia 7. AKI following ACE-I initiation, IV furosemide 8. Morbid obesity  Recommendation: Hypertension likely due to morbid obesity, however, given hypokalemia, will obtain an aldosterone to renin activity ratio to evaluate for hyperaldosteronism. Consider addition of spironolactone after labs have been obtained. Suspect AKI due to diuresis and drop in blood pressure, creatinine improving this morning.    Erling Conte, NP-C 01/11/2016, 7:40 AM [facility] Pager: [phone] Office: [phone] If no answer Cell [phone]

## 2016-01-11 NOTE — Care Management Note (Signed)
Case Management Note Donn Pierini RN, BSN Unit 2W-Case Manager [phone]  Patient Details  Name: Destiny Dunn MRN: [id number] Date of Birth: 1991/08/10  Subjective/Objective:   Pt admitted with HTN emergency                 Action/Plan: PTA pt lived at home- independent- plan to return home- referral received for PCP needs and possible medication assistance- spoke with pt at bedside- pt shows as having [employer]- asked pt if this was correct- pt states "yes, she does have [employer]" pt does not have a PCP- explained that she could use her insurance card to get a list of in-network providers- also provided pt with Management consultant # for provider referral list assistance- also asked pt about medications and if she had any difficulty getting meds- pt states that she does not- pt will have 4 scripts at discharge- that will cost roughly $25-30 dollars- depending on insurance coverage- No further CM needs noted.   Expected Discharge Date:    01/11/16              Expected Discharge Plan:  Home/Self Care  In-House Referral:     Discharge planning Services  CM Consult, Medication Assistance  Post Acute Care Choice:    Choice offered to:     DME Arranged:    DME Agency:     HH Arranged:    HH Agency:     Status of Service:  Completed, signed off  Medicare Important Message Given:    Date Medicare IM Given:    Medicare IM give by:    Date Additional Medicare IM Given:    Additional Medicare Important Message give by:     If discussed at Long Length of Stay Meetings, dates discussed:    Discharge Disposition: home/self care   Additional Comments:  Darrold Span, RN 01/11/2016, 10:07 AM

## 2016-01-11 NOTE — Discharge Summary (Addendum)
PATIENT DETAILS Name: Destiny Dunn Age: 25 y.o. Sex: female Date of Birth: [birth year] MRN: [id number]. Admitting Physician: Bobette Mo, MD PCP:No primary care provider on file.  Admit Date: 01/08/2016 Discharge date: 01/11/2016  Recommendations for Outpatient Follow-up:  1. Aldosterone Renin Ratio pending-please follow 2. Optimize anti-hypertensive regimen 3. Defer work up of causes of secondary HTN to the outpatient setting. 4. Please repeat BMET at next visit   PRIMARY DISCHARGE DIAGNOSIS:  Principal Problem:   Hypertensive emergency Active Problems:   Chest pain   Hypokalemia   CHF, acute (HCC)   Anemia   Morbid obesity (HCC)   Polycystic ovarian disease      PAST MEDICAL HISTORY: Past Medical History  Diagnosis Date  . Polycystic disease, ovaries   . Hypertension     DISCHARGE MEDICATIONS: Current Discharge Medication List    START taking these medications   Details  amLODipine (NORVASC) 5 MG tablet Take 1 tablet (5 mg total) by mouth daily. Qty: 30 tablet, Refills: 0    aspirin 81 MG EC tablet Take 1 tablet (81 mg total) by mouth daily. Qty: 30 tablet, Refills: 0    carvedilol (COREG) 12.5 MG tablet Take 1 tablet (12.5 mg total) by mouth 2 (two) times daily with a meal. Qty: 60 tablet, Refills: 0    pantoprazole (PROTONIX) 40 MG tablet Take 1 tablet (40 mg total) by mouth daily at 12 noon. Qty: 30 tablet, Refills: 0        ALLERGIES:  No Known Allergies  BRIEF HPI:  See H&P, Labs, Consult and Test reports for all details in brief, patient  is a 25 y.o. female with a past medical history of hypertension, polycystic ovaries, morbid obesity who presented to the emergency department with a history of chest pain since yesterday, pressure-like associated with dyspnea, palpitations, lower extremity edema and fatigue.She was found to have increased BP-started on IV NTG infusion and admitted for further evaluation and treatment  CONSULTATIONS:     cardiology  PERTINENT RADIOLOGIC STUDIES: Dg Chest 2 View  01/08/2016  CLINICAL DATA:  Chest pain for 2 days.  Shortness of breath EXAM: CHEST  2 VIEW COMPARISON:  None. FINDINGS: Cardiomegaly. Mild vascular congestion. No overt edema. No confluent opacities or effusions. No acute bony abnormality. IMPRESSION: Cardiomegaly, vascular congestion. Electronically Signed   By: Charlett Nose M.D.   On: 01/08/2016 19:45     PERTINENT LAB RESULTS: CBC:  Recent Labs  01/08/16 1857 01/09/16 0812  WBC 10.6* 9.6  HGB 10.3* 10.9*  HCT 32.8* 34.7*  PLT 306 307   CMET CMP     Component Value Date/Time   NA 141 01/11/2016 0210   K 3.5 01/11/2016 0210   CL 103 01/11/2016 0210   CO2 25 01/11/2016 0210   GLUCOSE 105* 01/11/2016 0210   BUN 24* 01/11/2016 0210   CREATININE 1.32* 01/11/2016 0210   CALCIUM 9.5 01/11/2016 0210   GFRNONAA 55* 01/11/2016 0210   GFRAA >60 01/11/2016 0210    GFR Estimated Creatinine Clearance: 81.9 mL/min (by C-G formula based on Cr of 1.32). No results for input(s): LIPASE, AMYLASE in the last 72 hours.  Recent Labs  01/09/16 0812 01/10/16 0425  TROPONINI 0.05* 0.05*   Invalid input(s): POCBNP No results for input(s): DDIMER in the last 72 hours. No results for input(s): HGBA1C in the last 72 hours.  Recent Labs  01/11/16 0210  CHOL 155  HDL 26*  LDLCALC 96  TRIG 657*  CHOLHDL 6.0  Recent Labs  01/10/16 1208  TSH 1.297   No results for input(s): VITAMINB12, FOLATE, FERRITIN, TIBC, IRON, RETICCTPCT in the last 72 hours. Coags: No results for input(s): INR in the last 72 hours.  Invalid input(s): PT Microbiology: No results found for this or any previous visit (from the past 240 hour(s)).   BRIEF HOSPITAL COURSE:  Hypertensive emergency: secondary to non compliance to medications-due to insurance issues. BP better-NTG infusion weaned off, continue Coreg and Amlodipine.  Will need workup for secondary causes of hypertension-however  that can be done in the outpatient setting.Have asked patient to follow up with Dr Alberteen Spindle 1 week.   Active Problems: Chest pain: Resolved, doubt cardiac. Echocardiogram did not show any wall motion abnormalities.  Acute diastolic heart failure: Much more compensated with IV Lasix, suspect this was secondary to uncontrolled hypertension. Watch off Diuretics for now-as compensated-follow with cardiology in the outpatient setting.  Minimally elevated troponins: Suspect secondary to CHF/hypertensive emergency-trend is flat-not consistent with ACS.Echo showed preserved EF without any wall motion abnormalities.  Mild ARF: Likely secondary to Lasix and better blood pressure control. Creatinine downtrending-will likely level out in a few days.Follow closely as outpatient .  Morbid obesity: Counseled regarding importance of weight loss  Known history of polycystic ovarian disease: Outpatient follow up with GYN.  Anemia: Chronic issue, suspect stable for outpatient workup/monitoring.  TODAY-DAY OF DISCHARGE:  Subjective:   Destiny Dunn today has no headache,no chest abdominal pain,no new weakness tingling or numbness, feels much better wants to go home today.   Objective:   Blood pressure 156/99, pulse 73, temperature 98.1 F (36.7 C), temperature source Oral, resp. rate 18, height 5\' 4"  (1.626 m), weight 117 kg (257 lb 15 oz), last menstrual period 12/08/2015, SpO2 100 %.  Intake/Output Summary (Last 24 hours) at 01/11/16 1438 Last data filed at 01/11/16 0815  Gross per 24 hour  Intake    720 ml  Output   1100 ml  Net   -380 ml   Filed Weights   01/09/16 1519 01/10/16 0508 01/11/16 0533  Weight: 113.944 kg (251 lb 3.2 oz) 114.941 kg (253 lb 6.4 oz) 117 kg (257 lb 15 oz)    Exam Awake Alert, Oriented *3, No new F.N deficits, Normal affect [place].AT,PERRAL Supple Neck,No JVD, No cervical lymphadenopathy appriciated.  Symmetrical Chest wall movement, Good air movement bilaterally,  CTAB RRR,No Gallops,Rubs or new Murmurs, No Parasternal Heave +ve B.Sounds, Abd Soft, Non tender, No organomegaly appriciated, No rebound -guarding or rigidity. No Cyanosis, Clubbing or edema, No new Rash or bruise  DISCHARGE CONDITION: Stable  DISPOSITION: Home  DISCHARGE INSTRUCTIONS:    Activity:  As tolerated with Full fall precautions use walker/cane & assistance as needed  Get Medicines reviewed and adjusted: Please take all your medications with you for your next visit with your Primary MD  Please request your Primary MD to go over all hospital tests and procedure/radiological results at the follow up, please ask your Primary MD to get all Hospital records sent to his/her office.  If you experience worsening of your admission symptoms, develop shortness of breath, life threatening emergency, suicidal or homicidal thoughts you must seek medical attention immediately by calling 911 or calling your MD immediately  if symptoms less severe.  You must read complete instructions/literature along with all the possible adverse reactions/side effects for all the Medicines you take and that have been prescribed to you. Take any new Medicines after you have completely understood and accpet all the possible  adverse reactions/side effects.   Do not drive when taking Pain medications.   Do not take more than prescribed Pain, Sleep and Anxiety Medications  Special Instructions: If you have smoked or chewed Tobacco  in the last 2 yrs please stop smoking, stop any regular Alcohol  and or any Recreational drug use.  Wear Seat belts while driving.  Please note  You were cared for by a hospitalist during your hospital stay. Once you are discharged, your primary care physician will handle any further medical issues. Please note that NO REFILLS for any discharge medications will be authorized once you are discharged, as it is imperative that you return to your primary care physician (or establish a  relationship with a primary care physician if you do not have one) for your aftercare needs so that they can reassess your need for medications and monitor your lab values.   Diet recommendation: Heart Healthy diet  Discharge Instructions    Call MD for:  persistant nausea and vomiting    Complete by:  As directed      Call MD for:  severe uncontrolled pain    Complete by:  As directed      Diet - low sodium heart healthy    Complete by:  As directed      Increase activity slowly    Complete by:  As directed            Follow-up Information    Schedule an appointment as soon as possible for a visit with Yates Decamp, MD.   Specialty:  Cardiology   Why:  Hospital follow up   Contact information:   [address] [place] [address] [phone]       Follow up with [facility].   Why:  if you no longer have insurance can call [facility] for appointment [phone]    Contact information:   Please call for Physician referral list assistance- [phone]-      Total Time spent on discharge equals 25  minutes.  SignedJeoffrey Massed 01/11/2016 2:38 PM

## 2016-01-13 NOTE — Progress Notes (Signed)
Our office called patient to schedule follow up appt and she declined and stated she would seek follow up elsewhere.

## 2016-01-14 LAB — ALDOSTERONE + RENIN ACTIVITY W/ RATIO
ALDO / PRA Ratio: 35.9 — ABNORMAL HIGH (ref 0.0–30.0)
Aldosterone: 25.7 ng/dL (ref 0.0–30.0)
PRA LC/MS/MS: 0.715 ng/mL/h (ref 0.167–5.380)

## 2016-02-19 ENCOUNTER — Encounter: Payer: Self-pay | Admitting: Cardiovascular Disease

## 2016-02-19 ENCOUNTER — Ambulatory Visit (INDEPENDENT_AMBULATORY_CARE_PROVIDER_SITE_OTHER): Payer: BLUE CROSS/BLUE SHIELD | Admitting: Cardiovascular Disease

## 2016-02-19 VITALS — BP 160/128 | HR 68 | Ht 64.0 in | Wt 255.0 lb

## 2016-02-19 DIAGNOSIS — I10 Essential (primary) hypertension: Secondary | ICD-10-CM | POA: Diagnosis not present

## 2016-02-19 DIAGNOSIS — I5031 Acute diastolic (congestive) heart failure: Secondary | ICD-10-CM | POA: Diagnosis not present

## 2016-02-19 DIAGNOSIS — E2609 Other primary hyperaldosteronism: Secondary | ICD-10-CM | POA: Diagnosis not present

## 2016-02-19 DIAGNOSIS — Z79899 Other long term (current) drug therapy: Secondary | ICD-10-CM | POA: Diagnosis not present

## 2016-02-19 MED ORDER — SPIRONOLACTONE 25 MG PO TABS: 25.0000 mg | ORAL_TABLET | Freq: Every day | ORAL | Status: DC

## 2016-02-19 NOTE — Progress Notes (Signed)
Cardiology Office Note   Date:  02/20/2016   ID:  LASHAY OSBORNE, DOB 1991/06/04, MRN [id number]  PCP:  Destiny Hansen, NP  Cardiologist:   Destiny Hook, MD   Chief Complaint  Patient presents with  . New Patient (Initial Visit)    In hospital 2 weeks ago, enlarged heart      History of Present Illness: Destiny Dunn is a 25 y.o. female with hypertension, PCOS, morbid obesity, and chronic diastolic heart failure who presents for hospital follow-up.  Destiny Dunn  was admitted 2/24 through 2/27 with hypertensive emergency and chest pain.   She presented with chest pain or shortness of breath and palpitations. Her blood pressure on presentation was 186/127. She had stopped her medications due to loss of insurance.  Echo revealed LVH and an EF of 50-55% and a restrictive filling pattern.  She was tested for primary hyperaldosteronism and the aldosterone to plasma renin ratio was elevated.  Also, plasma aldosterone was >15.  Given these findings and her history of hypokalemia, this is consistent with a diagnosis of primary hyperaldosteronism.  She reports being diagnosed with hypertension at age 28 or 42. Since then she's been on several different medications without being able to control her blood pressure well. At time she's been unable to afford her medication, which is what happened when she presented to the emergency department. She had been off her medicines for a month. She now has a steady job and has no difficulties with affording medicines.  Since being discharged from the hospital she is feeling much better. She notes mild lower extremity edema but denies orthopnea or PND.  Destiny Dunn walks 4 times per week for 40 minutes to an hour.  She is also on her feet all day at work. She reports that her diet is "okay." She is very conscientious of her salt intake and overall eats healthy foods. However she sometimes goes up to a day without eating. This is because in the past she  does not always have access to food and she is used not eating for a full day and time.     Past Medical History  Diagnosis Date  . Polycystic disease, ovaries   . Hypertension   . 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 No past surgeries       Current Outpatient Prescriptions  Medication Sig Dispense Refill  . amLODipine (NORVASC) 5 MG tablet Take 1 tablet (5 mg total) by mouth daily. 30 tablet 0  . aspirin 81 MG EC tablet Take 1 tablet (81 mg total) by mouth daily. 30 tablet 0  . carvedilol (COREG) 12.5 MG tablet Take 1 tablet (12.5 mg total) by mouth 2 (two) times daily with a meal. 60 tablet 0  . pantoprazole (PROTONIX) 40 MG tablet Take 1 tablet (40 mg total) by mouth daily at 12 noon. 30 tablet 0  . spironolactone (ALDACTONE) 25 MG tablet Take 1 tablet (25 mg total) by mouth daily. 30 tablet 5   No current facility-administered medications for this visit.    Allergies:   Review of patient's allergies indicates no known allergies.    Social History:  The patient  reports that she has never smoked. She has never used smokeless tobacco. She reports that she drinks alcohol. She reports that she does not use illicit drugs.   Family History:  The patient's family history includes Breast cancer in her maternal  grandmother; Hypertension in her mother.    ROS:  Please see the history of present illness.   Otherwise, review of systems are positive for none.   All other systems are reviewed and negative.    PHYSICAL EXAM: VS:  BP 160/128 mmHg  Pulse 68  Ht 5\' 4"  (1.626 m)  Wt 115.667 kg (255 lb)  BMI 43.75 kg/m2  LMP 02/08/2016 , BMI Body mass index is 43.75 kg/(m^2). GENERAL:  Well appearing HEENT:  Pupils equal round and reactive, fundi not visualized, oral mucosa unremarkable NECK:  No jugular venous distention, waveform within normal limits, carotid upstroke brisk and symmetric, no bruits, no  thyromegaly LYMPHATICS:  No cervical adenopathy LUNGS:  Clear to auscultation bilaterally HEART:  RRR.  PMI not displaced or sustained,S1 and S2 within normal limits, no S3, no S4, no clicks, no rubs, no murmurs ABD:  Flat, positive bowel sounds normal in frequency in pitch, no bruits, no rebound, no guarding, no midline pulsatile mass, no hepatomegaly, no splenomegaly EXT:  2 plus pulses throughout, no edema, no cyanosis no clubbing SKIN:  No rashes no nodules NEURO:  Cranial nerves II through XII grossly intact, motor grossly intact throughout PSYCH:  Cognitively intact, oriented to person place and time   EKG:  EKG is ordered today. The ekg ordered today demonstrates sinus rhythm rate 68 bpm.  Non-specific ST-T changes.  Echo 01/09/16: Study Conclusions  - Left ventricle: The cavity size was normal. There was moderate  concentric hypertrophy. Systolic function was normal. The  estimated ejection fraction was in the range of 50% to 55%.  Doppler parameters are consistent with restrictive physiology,  indicative of decreased left ventricular diastolic compliance  and/or increased left atrial pressure. Doppler parameters are  consistent with high ventricular filling pressure. - Left atrium: The atrium was moderately dilated. - Right ventricle: Systolic function was mildly reduced. - Right atrium: The atrium was mildly dilated. - Pericardium, extracardiac: A trivial pericardial effusion was  identified.  Recent Labs: 01/07/2016: Magnesium 2.0 01/08/2016: B Natriuretic Peptide 326.5* 01/09/2016: Hemoglobin 10.9*; Platelets 307 01/10/2016: TSH 1.297 01/11/2016: BUN 24*; Creatinine, Ser 1.32*; Potassium 3.5; Sodium 141   01/11/16: Plasma renin 0.175 Aldo/PRA ratio 35.9 Aldosterone 25.7   Lpid Panel    Component Value Date/Time   CHOL 155 01/11/2016 0210   TRIG 163* 01/11/2016 0210   HDL 26* 01/11/2016 0210   CHOLHDL 6.0 01/11/2016 0210   VLDL 33 01/11/2016 0210   LDLCALC  96 01/11/2016 0210      Wt Readings from Last 3 Encounters:  02/19/16 115.667 kg (255 lb)  01/11/16 117 kg (257 lb 15 oz)      ASSESSMENT AND PLAN:  # Primary hyeraldosteronism: Given Destiny Dunn's high serum aldosterone level and elevated Aldo to PRA ratio, she has a diagnosis of primary hyperaldosteronism.  She also has a history of hypokalemia.  We need to determine whether she has adrenal adenoma or bilateral adrenal hyperplasia. We will obtain an adrenal CT to make this distinction as well as to rule out an adrenal carcinoma start spironolactone 25 mg daily. .    # Hypertension: We will start spironolactone 25 mg daily and check a BMP in 2 weeks.  Continue carvedilol and amlodipine.    # CV Disease Prevention: Ms. Raechel AcheHackett is currently on aspirin. She i 24 and there is no clear indication. We will discontinue today.   Current medicines are reviewed at length with the patient today.  The patient does not have concerns regarding  medicines.  The following changes have been made:  no change  Labs/ tests ordered today include:   Orders Placed This Encounter  Procedures  . CT Adrenal Abd WO  . Basic metabolic panel  . EKG 12-Lead     Disposition:   FU with Devanie Galanti C. Duke Salvia, MD, [facility] in 1 month, [facility] in 2 weeks.   This note was written with the assistance of speech recognition software.  Please excuse any transcriptional errors.  Signed, Dany Walther C. Duke Salvia, MD, [facility]  02/20/2016 7:44 AM    [facility]

## 2016-02-19 NOTE — Patient Instructions (Addendum)
Medication Instructions:  START SPIRONOLACTONE 25 MG DAILY   Labwork: BMET IN 2 WEEKS WHEN YOUR RETURN TO SEE KRISTIN PHARM D WILL  BE DONE AFTER VISIT AT [facility] ON THE FIRST FLOOR  Testing/Procedures: ADRENAL CT AT [facility] [address] ONE April 13 ARRIVE AT 11:40 FOR A 12 NOON APPOINTMENT. YOU WILL NEED TO GO BY BEFORE April 12 TO PICK UP CONTRAST AND INSTRUCTIONS   Follow-Up: Your physician recommends that you schedule a follow-up appointment in: 2 weeks with Roney MarionKristin Pharm D for blood pressure check and labs downstairs  Your physician recommends that you schedule a follow-up appointment in: 1 month with Dr Duke Salviaandolph   If you need a refill on your cardiac medications before your next appointment, please call your pharmacy.

## 2016-02-20 ENCOUNTER — Encounter: Payer: Self-pay | Admitting: Cardiovascular Disease

## 2016-02-20 DIAGNOSIS — E2609 Other primary hyperaldosteronism: Secondary | ICD-10-CM

## 2016-02-20 HISTORY — DX: Other primary hyperaldosteronism: E26.09

## 2016-02-25 ENCOUNTER — Ambulatory Visit
Admission: RE | Admit: 2016-02-25 | Discharge: 2016-02-25 | Disposition: A | Discharge status: Home / Self Care | Payer: BLUE CROSS/BLUE SHIELD | Source: Ambulatory Visit | Attending: Cardiovascular Disease | Admitting: Cardiovascular Disease

## 2016-02-25 DIAGNOSIS — E2609 Other primary hyperaldosteronism: Secondary | ICD-10-CM

## 2016-03-04 ENCOUNTER — Ambulatory Visit (INDEPENDENT_AMBULATORY_CARE_PROVIDER_SITE_OTHER): Payer: BLUE CROSS/BLUE SHIELD | Admitting: Pharmacist Clinician (PhC)/ Clinical Pharmacy Specialist

## 2016-03-04 VITALS — BP 158/108 | HR 72 | Ht 64.0 in | Wt 261.4 lb

## 2016-03-04 DIAGNOSIS — E2609 Other primary hyperaldosteronism: Secondary | ICD-10-CM | POA: Diagnosis not present

## 2016-03-04 NOTE — Patient Instructions (Signed)
Return for a a follow up appointment in 3 weeks with Dr. Duke Salviaandolph  Hyperaldosteronism  Your blood pressure today is 158/108  (goal <140/90)  If you decide to get a blood pressure cuff, look at [employer] brand.  They seem to be most reliable.  [facility] and [facility] also work well.   Avoid [employer]/ [employer] meters  Take your BP meds as follows: continue with current medications  Go to the lab today for blood work  Bring all of your meds, your BP cuff and your record of home blood pressures to your next appointment.  Exercise as you're able, try to walk approximately 30 minutes per day.  Keep salt intake to a minimum, especially watch canned and prepared boxed foods.  Eat more fresh fruits and vegetables and fewer canned items.  Avoid eating in fast food restaurants.    HOW TO TAKE YOUR BLOOD PRESSURE: . Rest 5 minutes before taking your blood pressure. .  Don't smoke or drink caffeinated beverages for at least 30 minutes before. . Take your blood pressure before (not after) you eat. . Sit comfortably with your back supported and both feet on the floor (don't cross your legs). . Elevate your arm to heart level on a table or a desk. . Use the proper sized cuff. It should fit smoothly and snugly around your bare upper arm. There should be enough room to slip a fingertip under the cuff. The bottom edge of the cuff should be 1 inch above the crease of the elbow. . Ideally, take 3 measurements at one sitting and record the average.

## 2016-03-05 LAB — BASIC METABOLIC PANEL
BUN: 14 mg/dL (ref 7–25)
CHLORIDE: 106 mmol/L (ref 98–110)
CO2: 26 mmol/L (ref 20–31)
Calcium: 8.8 mg/dL (ref 8.6–10.2)
Creat: 0.95 mg/dL (ref 0.50–1.10)
Glucose, Bld: 72 mg/dL (ref 65–99)
POTASSIUM: 3.9 mmol/L (ref 3.5–5.3)
Sodium: 139 mmol/L (ref 135–146)

## 2016-03-07 ENCOUNTER — Encounter: Payer: Self-pay | Admitting: Pharmacist Clinician (PhC)/ Clinical Pharmacy Specialist

## 2016-03-07 MED ORDER — LISINOPRIL 10 MG PO TABS: 10.0000 mg | ORAL_TABLET | Freq: Every day | ORAL | Status: DC

## 2016-03-07 MED ORDER — SPIRONOLACTONE 25 MG PO TABS: 50.0000 mg | ORAL_TABLET | Freq: Every day | ORAL | Status: AC

## 2016-03-07 NOTE — Progress Notes (Signed)
     03/07/2016 Destiny ElkMashayla S Dunn 09/15/1991 [id number]   HPI:  Destiny ElkMashayla S Dunn is a 25 y.o. female patient of Dr Duke Salviaandolph, with a PMH below who presents today for hypertension clinic evaluation.  Destiny Dunn has a history of hypertension going back almost 15 years, to when she was 8 or 9.  She was admitted to [facility] on Feb 24 this year for hypertensive emergency.  She had lost her insurance and stopped taking her BP medications.  Blood work done after discharge showed a normal plasma renin level, an aldosterone level of 25.7 and an aldosterone/renin ratio of 35.9.  An abdominal ultrasound showed no indication of adrenal mass.  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  Today she tells me that she feels well, although has noticed some increased edema in her ankles and lower legs.  At her hospital stay she was taken off lisinopril 10 mg due to increased SCr and started on amlodipine 5 mg daily.    Cardiac Hx: hypertension, palpitations  Family Hx: mother also with hypertension, father history not known  Social Hx: no tobacco, occasional alcohol, no caffeine  Diet: patient enjoys cooking, does not add salt, but uses salt substitutes, mostly fresh vegetables  Exercise: walks about 45 minutes/day  Home BP readings: no home cuff  Current antihypertensive medications: carvedilol 12.5 mg bid, amlodipine 5 mg qd, spironolactone 25 mg qd  Intolerances: none  Wt Readings from Last 3 Encounters:  03/07/16 261 lb 6.4 oz (118.57 kg)  02/19/16 255 lb (115.667 kg)  01/11/16 257 lb 15 oz (117 kg)   BP Readings from Last 3 Encounters:  03/07/16 158/108  02/19/16 160/128  01/11/16 156/99   Pulse Readings from Last 3 Encounters:  03/07/16 72  02/19/16 68  01/11/16 73    Current Outpatient Prescriptions  Medication Sig Dispense Refill  . amLODipine (NORVASC) 5 MG tablet Take 1 tablet (5 mg  total) by mouth daily. 30 tablet 0  . aspirin 81 MG EC tablet Take 1 tablet (81 mg total) by mouth daily. 30 tablet 0  . carvedilol (COREG) 12.5 MG tablet Take 1 tablet (12.5 mg total) by mouth 2 (two) times daily with a meal. 60 tablet 0  . pantoprazole (PROTONIX) 40 MG tablet Take 1 tablet (40 mg total) by mouth daily at 12 noon. 30 tablet 0  . spironolactone (ALDACTONE) 25 MG tablet Take 1 tablet (25 mg total) by mouth daily. 30 tablet 5   No current facility-administered medications for this visit.    No Known Allergies  Past Medical History  Diagnosis Date  . Polycystic disease, ovaries   . Hypertension   . Primary hyperaldosteronism (HCC) 02/20/2016    Blood pressure 158/108, pulse 72, height 5\' 4"  (1.626 m), weight 261 lb 6.4 oz (118.57 kg), last menstrual period 02/08/2016.    Phillips HayKristin Phuc Kluttz PharmD CPP [facility]

## 2016-03-07 NOTE — Assessment & Plan Note (Addendum)
Patient with elevated blood pressure, hypokalemia and elevated aldosterone/plasma renin ratio.  Abdominal CT negative for adrenal mass.  Today patient notes lower extremity edema still present.  She has not been to lab for BMET since her visit with Dr. Duke Salviaandolph.  Her last BMET showed low potassium, but was drawn before starting spironolactone.  Also showed an elevated SCr.  Will need her to have labs drawn today so we can determine adjustments to be made to medication regimen.  If all labs WNL, would like to get her off amlodipine for now, to see if that is the cause of her LEE.  Plan to increase spironolactone to 50 mg qd and add back lisinopril 10 mg (was d/c at hospital due to elevated SCr).  Will wait for labs.    4/24 - reviewed labs K 3.9, SCr 0.95, CrCl (adjusted to IBW) 78.17.   Based on this, will have patient increase spironolactone to 50 mg qd, discontinue amlodipine and start lisinopril 10 mg qd.  Follow up with Dr. Duke Salviaandolph in 3 weeks, repeat BMET at that time.

## 2016-03-22 ENCOUNTER — Telehealth: Payer: Self-pay | Admitting: Cardiovascular Disease

## 2016-03-25 ENCOUNTER — Ambulatory Visit: Payer: BLUE CROSS/BLUE SHIELD | Admitting: Cardiovascular Disease

## 2016-03-25 NOTE — Telephone Encounter (Signed)
Closed Encounter  °

## 2016-04-08 ENCOUNTER — Ambulatory Visit: Payer: BLUE CROSS/BLUE SHIELD | Admitting: Cardiovascular Disease

## 2016-04-12 ENCOUNTER — Ambulatory Visit: Payer: BLUE CROSS/BLUE SHIELD | Admitting: Internal Medicine

## 2016-06-16 ENCOUNTER — Other Ambulatory Visit: Payer: Self-pay | Admitting: Cardiovascular Disease

## 2016-06-17 NOTE — Telephone Encounter (Signed)
Rx(s) sent to pharmacy electronically.  

## 2016-06-17 NOTE — Telephone Encounter (Signed)
Review for refill. 

## 2016-07-07 ENCOUNTER — Other Ambulatory Visit: Payer: Self-pay | Admitting: Cardiovascular Disease

## 2016-07-07 NOTE — Telephone Encounter (Signed)
Review for refill, Thank you. 

## 2016-08-31 ENCOUNTER — Other Ambulatory Visit: Payer: Self-pay | Admitting: Cardiovascular Disease

## 2016-08-31 NOTE — Telephone Encounter (Signed)
Review for refill. 

## 2016-09-01 NOTE — Telephone Encounter (Signed)
REFILL 

## 2016-09-29 ENCOUNTER — Other Ambulatory Visit: Payer: Self-pay | Admitting: Cardiovascular Disease

## 2016-09-29 NOTE — Telephone Encounter (Signed)
Rx(s) sent to pharmacy electronically.  

## 2016-09-29 NOTE — Telephone Encounter (Signed)
Please review for refill. Thanks!  

## 2016-10-04 ENCOUNTER — Other Ambulatory Visit: Payer: Self-pay

## 2016-10-04 MED ORDER — LISINOPRIL 10 MG PO TABS: 10.0000 mg | ORAL_TABLET | Freq: Every day | ORAL | 0 refills | Status: AC

## 2017-12-20 IMAGING — DX DG CHEST 2V
2 series · 2 of 2 positions shown · non-contrast
Comparison: None.

CLINICAL DATA: Chest pain for 2 days.  Shortness of breath

EXAM:
CHEST  2 VIEW

[w chest pa]
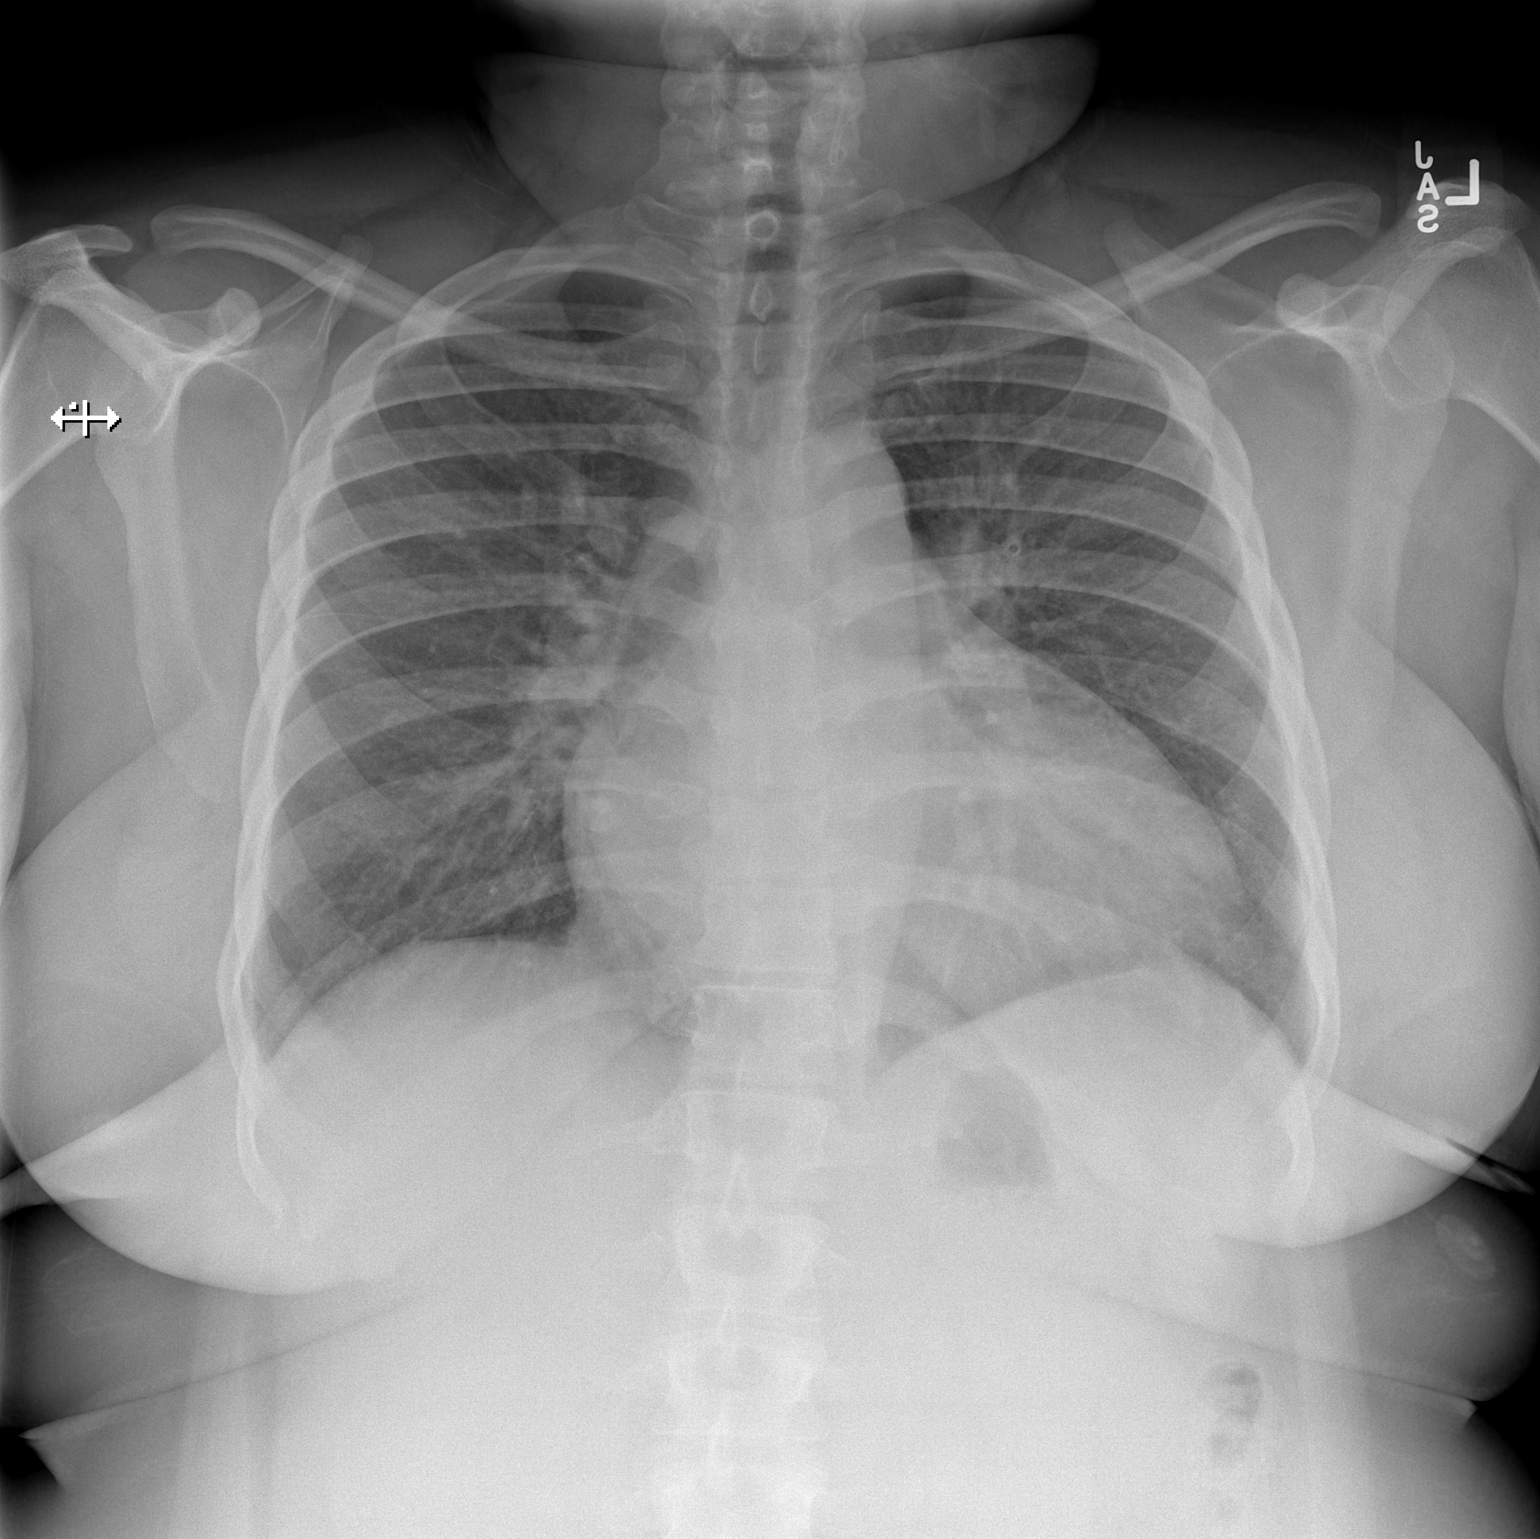

[w chest lat]
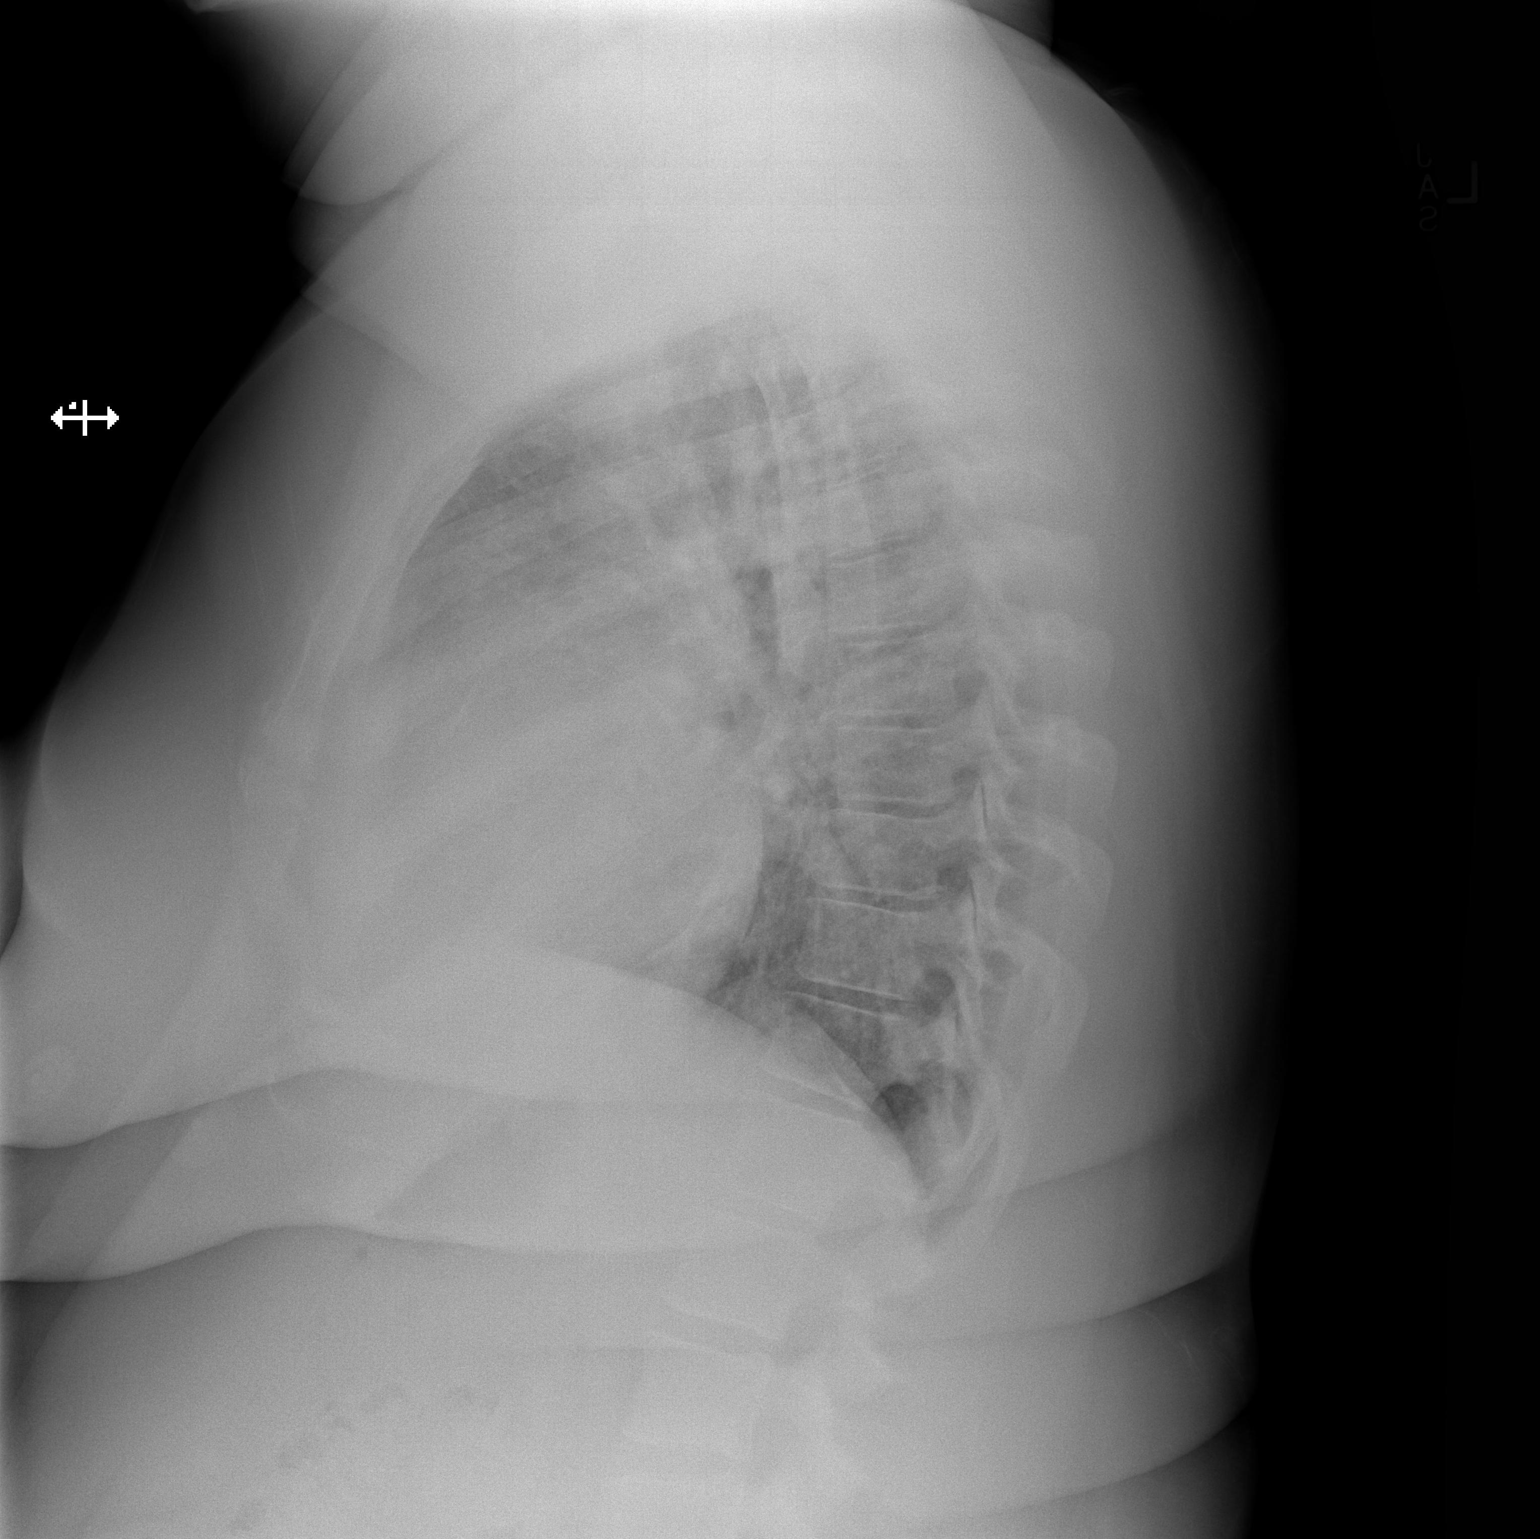

[2 of 2 positions shown; findings below may reference images not displayed]

FINDINGS: Cardiomegaly. Mild vascular congestion. No overt edema. No confluent
opacities or effusions. No acute bony abnormality.
IMPRESSION: Cardiomegaly, vascular congestion.
